# Patient Record
Sex: Female | Born: 1985 | Race: Black or African American | Hispanic: No | Marital: Married | Smoking: Never smoker
Health system: Southern US, Community
[De-identification: ages and names within clinical notes are randomized; demographics above are authoritative.]

## PROBLEM LIST (undated history)

## (undated) ENCOUNTER — Inpatient Hospital Stay (HOSPITAL_COMMUNITY): Payer: Self-pay

## (undated) DIAGNOSIS — Z8619 Personal history of other infectious and parasitic diseases: Secondary | ICD-10-CM

## (undated) DIAGNOSIS — J45909 Unspecified asthma, uncomplicated: Secondary | ICD-10-CM

## (undated) HISTORY — DX: Personal history of other infectious and parasitic diseases: Z86.19

## (undated) HISTORY — PX: BREAST BIOPSY: SHX20

## (undated) HISTORY — PX: BREAST CYST EXCISION: SHX579

---

## 2006-11-30 ENCOUNTER — Emergency Department (HOSPITAL_COMMUNITY): Admission: EM | Admit: 2006-11-30 | Discharge: 2006-12-01 | Payer: Self-pay | Admitting: Emergency Medicine

## 2009-02-09 ENCOUNTER — Encounter: Payer: Self-pay | Admitting: Nurse Practitioner

## 2009-03-02 ENCOUNTER — Ambulatory Visit: Payer: Self-pay | Admitting: Internal Medicine

## 2009-03-02 DIAGNOSIS — K625 Hemorrhage of anus and rectum: Secondary | ICD-10-CM

## 2009-03-02 DIAGNOSIS — R142 Eructation: Secondary | ICD-10-CM

## 2009-03-02 DIAGNOSIS — K5900 Constipation, unspecified: Secondary | ICD-10-CM | POA: Insufficient documentation

## 2009-03-02 DIAGNOSIS — R143 Flatulence: Secondary | ICD-10-CM

## 2009-03-02 DIAGNOSIS — R141 Gas pain: Secondary | ICD-10-CM

## 2009-03-02 DIAGNOSIS — R12 Heartburn: Secondary | ICD-10-CM

## 2009-03-16 ENCOUNTER — Telehealth (INDEPENDENT_AMBULATORY_CARE_PROVIDER_SITE_OTHER): Payer: Self-pay | Admitting: *Deleted

## 2010-12-29 LAB — POCT URINALYSIS DIP (DEVICE)
Glucose, UA: NEGATIVE
Nitrite: NEGATIVE
Operator id: 247071
Specific Gravity, Urine: 1.025
Urobilinogen, UA: 4 — ABNORMAL HIGH

## 2010-12-29 LAB — DIFFERENTIAL
Lymphs Abs: 1.1
Monocytes Relative: 12 — ABNORMAL HIGH
Neutro Abs: 2.6
Neutrophils Relative %: 61

## 2010-12-29 LAB — POCT I-STAT CREATININE: Creatinine, Ser: 0.7

## 2010-12-29 LAB — CBC
MCV: 72.9 — ABNORMAL LOW
RBC: 5.2 — ABNORMAL HIGH
WBC: 4.2

## 2010-12-29 LAB — I-STAT 8, (EC8 V) (CONVERTED LAB)
Acid-base deficit: 1
Chloride: 104
HCT: 44
Operator id: 265201
Potassium: 3.6
Sodium: 138

## 2014-04-28 LAB — OB RESULTS CONSOLE GC/CHLAMYDIA
CHLAMYDIA, DNA PROBE: NEGATIVE
Gonorrhea: NEGATIVE

## 2014-04-28 LAB — OB RESULTS CONSOLE HIV ANTIBODY (ROUTINE TESTING): HIV: NONREACTIVE

## 2014-04-28 LAB — OB RESULTS CONSOLE RUBELLA ANTIBODY, IGM: RUBELLA: IMMUNE

## 2014-04-28 LAB — OB RESULTS CONSOLE ABO/RH: RH Type: POSITIVE

## 2014-04-28 LAB — OB RESULTS CONSOLE HEPATITIS B SURFACE ANTIGEN: HEP B S AG: NEGATIVE

## 2014-04-28 LAB — OB RESULTS CONSOLE ANTIBODY SCREEN: ANTIBODY SCREEN: NEGATIVE

## 2014-04-28 LAB — OB RESULTS CONSOLE RPR: RPR: NONREACTIVE

## 2014-07-13 LAB — US OB FOLLOW UP

## 2014-07-14 ENCOUNTER — Other Ambulatory Visit (HOSPITAL_COMMUNITY): Payer: Self-pay | Admitting: Obstetrics and Gynecology

## 2014-07-14 DIAGNOSIS — O283 Abnormal ultrasonic finding on antenatal screening of mother: Secondary | ICD-10-CM

## 2014-07-14 DIAGNOSIS — Z3A20 20 weeks gestation of pregnancy: Secondary | ICD-10-CM

## 2014-07-14 DIAGNOSIS — Z3689 Encounter for other specified antenatal screening: Secondary | ICD-10-CM

## 2014-07-23 ENCOUNTER — Ambulatory Visit (HOSPITAL_COMMUNITY)
Admission: RE | Admit: 2014-07-23 | Discharge: 2014-07-23 | Disposition: A | Discharge status: Home / Self Care | Payer: BLUE CROSS/BLUE SHIELD | Source: Ambulatory Visit | Attending: Obstetrics and Gynecology | Admitting: Obstetrics and Gynecology

## 2014-07-23 ENCOUNTER — Encounter (HOSPITAL_COMMUNITY): Payer: Self-pay

## 2014-07-23 ENCOUNTER — Ambulatory Visit (HOSPITAL_COMMUNITY): Admission: RE | Admit: 2014-07-23 | Payer: BLUE CROSS/BLUE SHIELD | Source: Ambulatory Visit

## 2014-07-23 ENCOUNTER — Ambulatory Visit (HOSPITAL_COMMUNITY): Payer: Self-pay

## 2014-07-23 DIAGNOSIS — O99512 Diseases of the respiratory system complicating pregnancy, second trimester: Secondary | ICD-10-CM | POA: Insufficient documentation

## 2014-07-23 DIAGNOSIS — IMO0002 Reserved for concepts with insufficient information to code with codable children: Secondary | ICD-10-CM | POA: Insufficient documentation

## 2014-07-23 DIAGNOSIS — Z36 Encounter for antenatal screening of mother: Secondary | ICD-10-CM | POA: Diagnosis not present

## 2014-07-23 DIAGNOSIS — Z3A19 19 weeks gestation of pregnancy: Secondary | ICD-10-CM | POA: Insufficient documentation

## 2014-07-23 DIAGNOSIS — Z3A20 20 weeks gestation of pregnancy: Secondary | ICD-10-CM

## 2014-07-23 DIAGNOSIS — J45909 Unspecified asthma, uncomplicated: Secondary | ICD-10-CM | POA: Insufficient documentation

## 2014-07-23 DIAGNOSIS — O283 Abnormal ultrasonic finding on antenatal screening of mother: Secondary | ICD-10-CM

## 2014-07-23 DIAGNOSIS — Z3689 Encounter for other specified antenatal screening: Secondary | ICD-10-CM | POA: Insufficient documentation

## 2014-07-23 DIAGNOSIS — O350XX0 Maternal care for (suspected) central nervous system malformation in fetus, not applicable or unspecified: Secondary | ICD-10-CM | POA: Diagnosis not present

## 2014-07-23 HISTORY — DX: Unspecified asthma, uncomplicated: J45.909

## 2014-07-26 ENCOUNTER — Other Ambulatory Visit (HOSPITAL_COMMUNITY): Payer: Self-pay | Admitting: Obstetrics and Gynecology

## 2014-07-26 ENCOUNTER — Encounter (HOSPITAL_COMMUNITY): Payer: Self-pay | Admitting: Obstetrics and Gynecology

## 2014-07-29 ENCOUNTER — Encounter (HOSPITAL_COMMUNITY): Payer: Self-pay

## 2014-07-29 ENCOUNTER — Ambulatory Visit (HOSPITAL_COMMUNITY): Payer: Self-pay

## 2014-11-19 LAB — OB RESULTS CONSOLE GBS: GBS: NEGATIVE

## 2014-12-04 ENCOUNTER — Inpatient Hospital Stay (HOSPITAL_COMMUNITY)
Admission: AD | Admit: 2014-12-04 | Discharge: 2014-12-04 | Disposition: A | Discharge status: Home / Self Care | Payer: Commercial Managed Care - PPO | Source: Ambulatory Visit | Attending: Obstetrics and Gynecology | Admitting: Obstetrics and Gynecology

## 2014-12-04 ENCOUNTER — Inpatient Hospital Stay (HOSPITAL_COMMUNITY): Payer: Commercial Managed Care - PPO

## 2014-12-04 ENCOUNTER — Encounter (HOSPITAL_COMMUNITY): Payer: Self-pay | Admitting: *Deleted

## 2014-12-04 DIAGNOSIS — Z3A39 39 weeks gestation of pregnancy: Secondary | ICD-10-CM | POA: Diagnosis not present

## 2014-12-04 DIAGNOSIS — O4693 Antepartum hemorrhage, unspecified, third trimester: Secondary | ICD-10-CM | POA: Insufficient documentation

## 2014-12-04 DIAGNOSIS — O4290 Premature rupture of membranes, unspecified as to length of time between rupture and onset of labor, unspecified weeks of gestation: Secondary | ICD-10-CM

## 2014-12-04 DIAGNOSIS — O26893 Other specified pregnancy related conditions, third trimester: Secondary | ICD-10-CM | POA: Diagnosis present

## 2014-12-04 LAB — AMNISURE RUPTURE OF MEMBRANE (ROM) NOT AT ARMC: Amnisure ROM: NEGATIVE

## 2014-12-04 LAB — URINALYSIS, ROUTINE W REFLEX MICROSCOPIC
BILIRUBIN URINE: NEGATIVE
GLUCOSE, UA: NEGATIVE mg/dL
Hgb urine dipstick: NEGATIVE
KETONES UR: 15 mg/dL — AB
LEUKOCYTES UA: NEGATIVE
Nitrite: NEGATIVE
PH: 6.5 (ref 5.0–8.0)
Protein, ur: NEGATIVE mg/dL
SPECIFIC GRAVITY, URINE: 1.02 (ref 1.005–1.030)
Urobilinogen, UA: 1 mg/dL (ref 0.0–1.0)

## 2014-12-04 LAB — CBC
HCT: 30.8 % — ABNORMAL LOW (ref 36.0–46.0)
Hemoglobin: 10.1 g/dL — ABNORMAL LOW (ref 12.0–15.0)
MCH: 24.6 pg — ABNORMAL LOW (ref 26.0–34.0)
MCHC: 32.8 g/dL (ref 30.0–36.0)
MCV: 74.9 fL — ABNORMAL LOW (ref 78.0–100.0)
Platelets: 170 10*3/uL (ref 150–400)
RBC: 4.11 MIL/uL (ref 3.87–5.11)
RDW: 15 % (ref 11.5–15.5)
WBC: 9.8 10*3/uL (ref 4.0–10.5)

## 2014-12-04 LAB — FIBRINOGEN: Fibrinogen: 438 mg/dL (ref 204–475)

## 2014-12-04 LAB — PROTIME-INR
INR: 1.03 (ref 0.00–1.49)
Prothrombin Time: 13.7 seconds (ref 11.6–15.2)

## 2014-12-04 LAB — APTT: aPTT: 28 seconds (ref 24–37)

## 2014-12-04 NOTE — MAU Note (Signed)
Pt states last night about 1800 she got up to use the bathroom.  She noticed her panties had gotten wet along with some vaginal discharge when whipping with toilet paper.  Pt states her panties continue to get somewhat wet at times.  No vaginal bleeding or big loss of fluid.  Good fetal movement.  Pt states she has been experiencing some braxton hicks contractions but that have stopped.

## 2014-12-04 NOTE — Discharge Instructions (Signed)
Placental Abruption °Your placenta is the organ that nourishes your unborn baby (fetus). Your baby gets his or her blood supply and nutrients through your placenta. It is your baby's life support system. It is attached to the inside of your uterus until after your baby is born.  °Placental abruption is when the placenta partly or completely separates from the uterus before your baby is born. This is rare, but it can happen any time after 20 weeks of pregnancy. A small separation may not cause problems, but a large separation may be dangerous for you and your baby. °CAUSES  °Most of the time the cause of a placental abruption is unknown. Though it is rare, a placental abruption can be caused by:  °· An abdominal injury.   °· The baby turning from a buttocks-first position (breech presentation) or a sideways position (transverse) to a headfirst position (cephalic).   °· Delivering the first of multiple babies (twins, triplets, or more).   °· Sudden loss of amniotic fluid (premature rupture of the membranes).   °· An abnormally short umbilical cord. °RISK FACTORS °Some risk factors make a placental abruption more likely, including: °· History of placental abruption. °· High blood pressure (hypertension). °· Smoking. °· Alcohol intake. °· Blood clotting problems. °· Too much amniotic fluid. °· Having had multiples (twins or triplets or more). °· Seizures and convulsions. °· Diabetes mellitus. °· Having had more than four children. °· Age 35 years or older. °· Illegal drug use. °· Injury to your abdomen. °SIGNS AND SYMPTOMS  °A small placental abruption may not cause symptoms. If you do have symptoms, they may include: °· Mild abdominal pain. °· Slight vaginal bleeding. °Symptoms of severe placental abruption depend on the size of the separation and the stage of pregnancy. Symptoms may include:  °· Sudden pain in your uterus. °· Abdominal pain. °· Vaginal bleeding. °· Tender uterus. °· Severe abdominal pain with  tenderness. °· Continual contractions of your uterus. °· Back pain. °· Weakness, light-headedness. °DIAGNOSIS  °Placental abruption is suspected when a pregnant woman develops sudden pain in her uterus. The health care provider will check whether the uterus is very tender, hard, and enlarging and whether the baby has an abnormal heart rate or rhythm. Ultrasonography (commonly called an ultrasound) will be done. Blood work will also be done to make sure that there are enough healthy red blood cells and that there are no clotting problems or signs of too much blood loss. °TREATMENT  °Placental abruption is usually an emergency. It requires treatment right away. Your treatment will depend on:  °· The amount of bleeding. °· Whether you or you baby are in distress. °· The stage of your pregnancy. °· The maturity of the baby. °Treatment for partial separation of the placenta is bed rest and close observation. You also may need a blood transfusion or to receive fluids through an IV tube. Treatment for complete placental separation is delivery of your baby. You may have a cesarean delivery if your baby is in distress. °HOME CARE INSTRUCTIONS  °· Only take medicines as directed by your health care provider. °· Arrange for help at home before and after you deliver the baby, especially if you had a cesarean delivery or lost a lot of blood. °· Get plenty of rest and sleep. °· Do not have sexual intercourse until your health care provider says it is okay. °· Do not use tampons or douche unless your health care provider says it is okay. °SEEK MEDICAL CARE IF: °· You   have light vaginal bleeding or spotting. °· You have any type of trauma, such as a fall or jolt during an accident. °· You are having trouble avoiding drugs, alcohol, or smoking. °SEEK IMMEDIATE MEDICAL CARE IF: °· You have vaginal bleeding. °· You have abdominal pain. °· You have continuous uterine contractions. °· You have a hard, tender uterus. °· You do not feel  the baby move, or the baby moves very little. °MAKE SURE YOU: °· Understand these instructions. °· Will watch your condition. °· Will get help right away if you are not doing well or get worse. °Document Released: 03/05/2005 Document Revised: 03/10/2013 Document Reviewed: 12/26/2012 °ExitCare® Patient Information ©2015 [employer]. This information is not intended to replace advice given to you by your health care provider. Make sure you discuss any questions you have with your health care provider. ° °

## 2014-12-04 NOTE — MAU Provider Note (Signed)
History     CSN: [id number]  Arrival date and time: 12/04/14 1248   First Provider Initiated Contact with Patient 12/04/14 1624      Chief Complaint  Patient presents with  . Rupture of Membranes   HPI  Kaitlyn Ray 29 y.o. G1P0 [redacted]w[redacted]d preents to the MAU stating that she thinks her water broke at 600pm last night. Denies leaking fluid since then but states her stomach is sore in her lower abdomen. Reports positive fetal movement. Nurse has asked for me to perform spec exam. Positive for pooling.  Past Medical History  Diagnosis Date  . Asthma     Past Surgical History  Procedure Laterality Date  . Breast biopsy    . Breast cyst excision      History reviewed. No pertinent family history.  Social History  Substance Use Topics  . Smoking status: Never Smoker   . Smokeless tobacco: None  . Alcohol Use: No    Allergies: No Known Allergies  Prescriptions prior to admission  Medication Sig Dispense Refill Last Dose  . albuterol (PROVENTIL HFA;VENTOLIN HFA) 108 (90 BASE) MCG/ACT inhaler Inhale 2 puffs into the lungs every 6 (six) hours as needed for wheezing or shortness of breath.    prn  . Calcium Carbonate-Vitamin D (CALCIUM + D PO) Take 1 tablet by mouth daily.    Past Week at Unknown time  . docusate sodium (COLACE) 100 MG capsule Take 100 mg by mouth daily.   Past Week at Unknown time  . Prenat w/o A-FeCbGl-DSS-FA-DHA (CITRANATAL 90 DHA) 90-1 & 300 MG MISC Take 1 tablet by mouth daily.    Past Week at Unknown time    Review of Systems  Constitutional: Negative for fever.  Gastrointestinal: Positive for abdominal pain.  All other systems reviewed and are negative.  Physical Exam   Blood pressure 126/85, pulse 85, temperature 97.9 F (36.6 C), temperature source Oral, resp. rate 18, last menstrual period 02/23/2014, SpO2 99 %.  Physical Exam  Nursing note and vitals reviewed. Constitutional: She is oriented to person, place, and time. She appears  well-developed and well-nourished. No distress.  HENT:  Head: Normocephalic and atraumatic.  Neck: Normal range of motion.  Cardiovascular: Normal rate.   Respiratory: Effort normal. No respiratory distress.  GI: Soft. There is tenderness.  Musculoskeletal: She exhibits no edema.  Neurological: She is alert and oriented to person, place, and time.  Skin: Skin is warm and dry.  Psychiatric: She has a normal mood and affect. Her behavior is normal. Judgment and thought content normal.   On 2nd spec exam approx 30cc blood seen coming from cervix  Placenta posterior above cervical os- AFI 16. Results for orders placed or performed during the hospital encounter of 12/04/14 (from the past 24 hour(s))  Urinalysis, Routine w reflex microscopic (not at [facility])     Status: Abnormal   Collection Time: 12/04/14  1:00 PM  Result Value Ref Range   Color, Urine YELLOW YELLOW   APPearance CLEAR CLEAR   Specific Gravity, Urine 1.020 1.005 - 1.030   pH 6.5 5.0 - 8.0   Glucose, UA NEGATIVE NEGATIVE mg/dL   Hgb urine dipstick NEGATIVE NEGATIVE   Bilirubin Urine NEGATIVE NEGATIVE   Ketones, ur 15 (A) NEGATIVE mg/dL   Protein, ur NEGATIVE NEGATIVE mg/dL   Urobilinogen, UA 1.0 0.0 - 1.0 mg/dL   Nitrite NEGATIVE NEGATIVE   Leukocytes, UA NEGATIVE NEGATIVE  Amnisure rupture of membrane (rom)not at [facility]  Status: None   Collection Time: 12/04/14  2:00 PM  Result Value Ref Range   Amnisure ROM NEGATIVE   CBC     Status: Abnormal   Collection Time: 12/04/14  2:58 PM  Result Value Ref Range   WBC 9.8 4.0 - 10.5 K/uL   RBC 4.11 3.87 - 5.11 MIL/uL   Hemoglobin 10.1 (L) 12.0 - 15.0 g/dL   HCT 19.1 (L) 47.8 - 29.5 %   MCV 74.9 (L) 78.0 - 100.0 fL   MCH 24.6 (L) 26.0 - 34.0 pg   MCHC 32.8 30.0 - 36.0 g/dL   RDW 62.1 30.8 - 65.7 %   Platelets 170 150 - 400 K/uL  Protime-INR     Status: None   Collection Time: 12/04/14  5:05 PM  Result Value Ref Range   Prothrombin Time 13.7 11.6 - 15.2 seconds   INR  1.03 0.00 - 1.49  APTT     Status: None   Collection Time: 12/04/14  5:05 PM  Result Value Ref Range   aPTT 28 24 - 37 seconds  Fibrinogen     Status: None   Collection Time: 12/04/14  5:05 PM  Result Value Ref Range   Fibrinogen 438 204 - 475 mg/dL   MAU Course  Procedures  MDM After all labs are back and stable. With normal AFI and reactive fetal monitoring strip. Pt will be discharge to home per Dr Vincente Poli.  Assessment and Plan  Vaginal bleeding in pregnancy  Discharge to home  [facility] 12/04/2014, 4:43 PM

## 2014-12-09 ENCOUNTER — Telehealth (HOSPITAL_COMMUNITY): Payer: Self-pay | Admitting: *Deleted

## 2014-12-09 ENCOUNTER — Encounter (HOSPITAL_COMMUNITY): Payer: Self-pay | Admitting: *Deleted

## 2014-12-09 NOTE — Telephone Encounter (Signed)
Preadmission screen  

## 2014-12-13 MED ORDER — BUTORPHANOL TARTRATE 1 MG/ML IJ SOLN
1.0000 mg | INTRAMUSCULAR | Status: DC | PRN
  Administered 2014-12-14: 1 mg via INTRAVENOUS
  Filled 2014-12-13 (×2): qty 1

## 2014-12-13 MED ORDER — PROMETHAZINE HCL 25 MG/ML IJ SOLN
12.5000 mg | INTRAMUSCULAR | Status: DC | PRN
  Filled 2014-12-13: qty 1

## 2014-12-14 ENCOUNTER — Inpatient Hospital Stay (HOSPITAL_COMMUNITY): Payer: Commercial Managed Care - PPO | Admitting: Anesthesiology

## 2014-12-14 ENCOUNTER — Inpatient Hospital Stay (HOSPITAL_COMMUNITY)
Admission: RE | Admit: 2014-12-14 | Discharge: 2014-12-17 | DRG: 766 | Disposition: A | Discharge status: Home / Self Care | Payer: Commercial Managed Care - PPO | Source: Ambulatory Visit | Attending: Obstetrics and Gynecology | Admitting: Obstetrics and Gynecology

## 2014-12-14 ENCOUNTER — Encounter (HOSPITAL_COMMUNITY)
Admission: RE | Disposition: A | Discharge status: Home / Self Care | Payer: Self-pay | Source: Ambulatory Visit | Attending: Obstetrics and Gynecology

## 2014-12-14 ENCOUNTER — Encounter (HOSPITAL_COMMUNITY): Payer: Self-pay

## 2014-12-14 DIAGNOSIS — J45909 Unspecified asthma, uncomplicated: Secondary | ICD-10-CM | POA: Diagnosis present

## 2014-12-14 DIAGNOSIS — O9952 Diseases of the respiratory system complicating childbirth: Secondary | ICD-10-CM | POA: Diagnosis present

## 2014-12-14 DIAGNOSIS — Z3A40 40 weeks gestation of pregnancy: Secondary | ICD-10-CM | POA: Diagnosis present

## 2014-12-14 DIAGNOSIS — O480 Post-term pregnancy: Secondary | ICD-10-CM | POA: Diagnosis present

## 2014-12-14 LAB — TYPE AND SCREEN
ABO/RH(D): O POS
ANTIBODY SCREEN: NEGATIVE

## 2014-12-14 LAB — ABO/RH: ABO/RH(D): O POS

## 2014-12-14 LAB — CBC
HEMATOCRIT: 32.8 % — AB (ref 36.0–46.0)
HEMOGLOBIN: 11 g/dL — AB (ref 12.0–15.0)
MCH: 25.1 pg — ABNORMAL LOW (ref 26.0–34.0)
MCHC: 33.5 g/dL (ref 30.0–36.0)
MCV: 74.7 fL — ABNORMAL LOW (ref 78.0–100.0)
Platelets: 172 10*3/uL (ref 150–400)
RBC: 4.39 MIL/uL (ref 3.87–5.11)
RDW: 15 % (ref 11.5–15.5)
WBC: 9.9 10*3/uL (ref 4.0–10.5)

## 2014-12-14 SURGERY — Surgical Case
Anesthesia: Epidural

## 2014-12-14 MED ORDER — TETANUS-DIPHTH-ACELL PERTUSSIS 5-2.5-18.5 LF-MCG/0.5 IM SUSP: 0.5000 mL | Freq: Once | INTRAMUSCULAR | Status: DC

## 2014-12-14 MED ORDER — SODIUM CHLORIDE 0.9 % IJ SOLN: 3.0000 mL | INTRAMUSCULAR | Status: DC | PRN

## 2014-12-14 MED ORDER — ACETAMINOPHEN 325 MG PO TABS: 650.0000 mg | ORAL_TABLET | ORAL | Status: DC | PRN

## 2014-12-14 MED ORDER — OXYTOCIN 10 UNIT/ML IJ SOLN
40.0000 [IU] | INTRAVENOUS | Status: DC | PRN
  Administered 2014-12-14: 40 [IU] via INTRAVENOUS

## 2014-12-14 MED ORDER — EPHEDRINE 5 MG/ML INJ: 10.0000 mg | INTRAVENOUS | Status: DC | PRN

## 2014-12-14 MED ORDER — CROMOLYN SODIUM 4 % OP SOLN
1.0000 [drp] | Freq: Four times a day (QID) | OPHTHALMIC | Status: DC
Start: 2014-12-14 — End: 2014-12-17
  Administered 2014-12-14 – 2014-12-16 (×5): 1 [drp] via OPHTHALMIC

## 2014-12-14 MED ORDER — MEPERIDINE HCL 25 MG/ML IJ SOLN
INTRAMUSCULAR | Status: DC | PRN
  Administered 2014-12-14 (×2): 12.5 mg via INTRAVENOUS

## 2014-12-14 MED ORDER — MORPHINE SULFATE (PF) 0.5 MG/ML IJ SOLN
INTRAMUSCULAR | Status: DC | PRN
  Administered 2014-12-14: 3 mg via EPIDURAL

## 2014-12-14 MED ORDER — ZOLPIDEM TARTRATE 5 MG PO TABS: 5.0000 mg | ORAL_TABLET | Freq: Every evening | ORAL | Status: DC | PRN

## 2014-12-14 MED ORDER — FENTANYL CITRATE (PF) 100 MCG/2ML IJ SOLN
INTRAMUSCULAR | Status: AC
  Administered 2014-12-14: 50 ug via INTRAVENOUS
  Filled 2014-12-14: qty 2

## 2014-12-14 MED ORDER — NALBUPHINE HCL 10 MG/ML IJ SOLN
5.0000 mg | INTRAMUSCULAR | Status: DC | PRN
  Administered 2014-12-14: 5 mg via SUBCUTANEOUS
  Filled 2014-12-14 (×2): qty 0.5

## 2014-12-14 MED ORDER — TERBUTALINE SULFATE 1 MG/ML IJ SOLN: 0.2500 mg | Freq: Once | INTRAMUSCULAR | Status: DC | PRN

## 2014-12-14 MED ORDER — ONDANSETRON HCL 4 MG/2ML IJ SOLN: 4.0000 mg | Freq: Three times a day (TID) | INTRAMUSCULAR | Status: DC | PRN

## 2014-12-14 MED ORDER — IBUPROFEN 600 MG PO TABS
600.0000 mg | ORAL_TABLET | Freq: Four times a day (QID) | ORAL | Status: DC
  Administered 2014-12-15 – 2014-12-17 (×12): 600 mg via ORAL
  Filled 2014-12-14 (×12): qty 1

## 2014-12-14 MED ORDER — FENTANYL CITRATE (PF) 100 MCG/2ML IJ SOLN
INTRAMUSCULAR | Status: DC | PRN
  Administered 2014-12-14 (×2): 50 ug via INTRAVENOUS

## 2014-12-14 MED ORDER — ONDANSETRON HCL 4 MG/2ML IJ SOLN: 4.0000 mg | Freq: Four times a day (QID) | INTRAMUSCULAR | Status: DC | PRN

## 2014-12-14 MED ORDER — LIDOCAINE HCL (PF) 1 % IJ SOLN: 30.0000 mL | INTRAMUSCULAR | Status: DC | PRN

## 2014-12-14 MED ORDER — ONDANSETRON HCL 4 MG/2ML IJ SOLN
INTRAMUSCULAR | Status: DC | PRN
  Administered 2014-12-14: 4 mg via INTRAVENOUS

## 2014-12-14 MED ORDER — OXYTOCIN 40 UNITS IN LACTATED RINGERS INFUSION - SIMPLE MED: 62.5000 mL/h | INTRAVENOUS | Status: AC

## 2014-12-14 MED ORDER — SIMETHICONE 80 MG PO CHEW: 80.0000 mg | CHEWABLE_TABLET | ORAL | Status: DC | PRN

## 2014-12-14 MED ORDER — MORPHINE SULFATE (PF) 0.5 MG/ML IJ SOLN
INTRAMUSCULAR | Status: AC
  Filled 2014-12-14: qty 100

## 2014-12-14 MED ORDER — OXYCODONE-ACETAMINOPHEN 5-325 MG PO TABS: 2.0000 | ORAL_TABLET | ORAL | Status: DC | PRN

## 2014-12-14 MED ORDER — NALOXONE HCL 0.4 MG/ML IJ SOLN: 0.4000 mg | INTRAMUSCULAR | Status: DC | PRN

## 2014-12-14 MED ORDER — BUPIVACAINE HCL (PF) 0.25 % IJ SOLN
INTRAMUSCULAR | Status: DC | PRN
  Administered 2014-12-14 (×2): 4 mL via EPIDURAL

## 2014-12-14 MED ORDER — INFLUENZA VAC SPLIT QUAD 0.5 ML IM SUSY
0.5000 mL | PREFILLED_SYRINGE | INTRAMUSCULAR | Status: DC
  Filled 2014-12-14: qty 0.5

## 2014-12-14 MED ORDER — ACETAMINOPHEN 325 MG PO TABS
650.0000 mg | ORAL_TABLET | ORAL | Status: DC | PRN
  Administered 2014-12-14: 650 mg via ORAL
  Filled 2014-12-14: qty 2

## 2014-12-14 MED ORDER — FENTANYL CITRATE (PF) 100 MCG/2ML IJ SOLN
25.0000 ug | INTRAMUSCULAR | Status: DC | PRN
  Administered 2014-12-14 (×3): 50 ug via INTRAVENOUS

## 2014-12-14 MED ORDER — SIMETHICONE 80 MG PO CHEW
80.0000 mg | CHEWABLE_TABLET | ORAL | Status: DC
Start: 2014-12-15 — End: 2014-12-17
  Administered 2014-12-15 – 2014-12-17 (×3): 80 mg via ORAL
  Filled 2014-12-14 (×3): qty 1

## 2014-12-14 MED ORDER — OXYCODONE-ACETAMINOPHEN 5-325 MG PO TABS
1.0000 | ORAL_TABLET | ORAL | Status: DC | PRN
  Filled 2014-12-14: qty 1

## 2014-12-14 MED ORDER — LACTATED RINGERS IV SOLN: INTRAVENOUS | Status: DC

## 2014-12-14 MED ORDER — ALBUTEROL SULFATE (2.5 MG/3ML) 0.083% IN NEBU: 3.0000 mL | INHALATION_SOLUTION | Freq: Four times a day (QID) | RESPIRATORY_TRACT | Status: DC | PRN

## 2014-12-14 MED ORDER — FENTANYL 2.5 MCG/ML BUPIVACAINE 1/10 % EPIDURAL INFUSION (WH - ANES)
14.0000 mL/h | INTRAMUSCULAR | Status: DC | PRN
  Administered 2014-12-14: 14 mL/h via EPIDURAL
  Filled 2014-12-14: qty 125

## 2014-12-14 MED ORDER — PRENATAL MULTIVITAMIN CH
1.0000 | ORAL_TABLET | Freq: Every day | ORAL | Status: DC
  Administered 2014-12-15 – 2014-12-17 (×3): 1 via ORAL
  Filled 2014-12-14 (×3): qty 1

## 2014-12-14 MED ORDER — OXYCODONE-ACETAMINOPHEN 5-325 MG PO TABS: 1.0000 | ORAL_TABLET | ORAL | Status: DC | PRN

## 2014-12-14 MED ORDER — DIPHENHYDRAMINE HCL 25 MG PO CAPS: 25.0000 mg | ORAL_CAPSULE | Freq: Four times a day (QID) | ORAL | Status: DC | PRN

## 2014-12-14 MED ORDER — OXYTOCIN 40 UNITS IN LACTATED RINGERS INFUSION - SIMPLE MED
1.0000 m[IU]/min | INTRAVENOUS | Status: DC
  Administered 2014-12-14: 2 m[IU]/min via INTRAVENOUS
  Filled 2014-12-14: qty 1000

## 2014-12-14 MED ORDER — FENTANYL CITRATE (PF) 100 MCG/2ML IJ SOLN
INTRAMUSCULAR | Status: AC
  Filled 2014-12-14: qty 2

## 2014-12-14 MED ORDER — KETOROLAC TROMETHAMINE 30 MG/ML IJ SOLN
INTRAMUSCULAR | Status: AC
  Administered 2014-12-14: 30 mg via INTRAVENOUS
  Filled 2014-12-14: qty 1

## 2014-12-14 MED ORDER — NALBUPHINE HCL 10 MG/ML IJ SOLN
5.0000 mg | Freq: Once | INTRAMUSCULAR | Status: DC | PRN
  Filled 2014-12-14 (×2): qty 0.5

## 2014-12-14 MED ORDER — OXYTOCIN BOLUS FROM INFUSION: 500.0000 mL | INTRAVENOUS | Status: DC

## 2014-12-14 MED ORDER — SCOPOLAMINE 1 MG/3DAYS TD PT72
MEDICATED_PATCH | TRANSDERMAL | Status: AC
  Administered 2014-12-14: 1.5 mg via TRANSDERMAL
  Filled 2014-12-14: qty 1

## 2014-12-14 MED ORDER — MEPERIDINE HCL 25 MG/ML IJ SOLN
INTRAMUSCULAR | Status: AC
  Filled 2014-12-14: qty 1

## 2014-12-14 MED ORDER — CITRIC ACID-SODIUM CITRATE 334-500 MG/5ML PO SOLN: 30.0000 mL | ORAL | Status: DC | PRN

## 2014-12-14 MED ORDER — LIDOCAINE-EPINEPHRINE (PF) 2 %-1:200000 IJ SOLN
INTRAMUSCULAR | Status: DC | PRN
Start: 2014-12-14 — End: 2014-12-14
  Administered 2014-12-14: 4 mL

## 2014-12-14 MED ORDER — OXYTOCIN 40 UNITS IN LACTATED RINGERS INFUSION - SIMPLE MED: 62.5000 mL/h | INTRAVENOUS | Status: DC

## 2014-12-14 MED ORDER — CITRIC ACID-SODIUM CITRATE 334-500 MG/5ML PO SOLN
ORAL | Status: AC
  Administered 2014-12-14: 30 mL
  Filled 2014-12-14: qty 15

## 2014-12-14 MED ORDER — NALBUPHINE HCL 10 MG/ML IJ SOLN
5.0000 mg | INTRAMUSCULAR | Status: DC | PRN
  Administered 2014-12-14: 5 mg via INTRAVENOUS
  Filled 2014-12-14 (×2): qty 0.5

## 2014-12-14 MED ORDER — LACTATED RINGERS IV SOLN
INTRAVENOUS | Status: DC
  Administered 2014-12-14: 07:00:00 via INTRAVENOUS

## 2014-12-14 MED ORDER — SODIUM CHLORIDE 0.9 % IR SOLN
Status: DC | PRN
  Administered 2014-12-14: 1

## 2014-12-14 MED ORDER — KETOROLAC TROMETHAMINE 30 MG/ML IJ SOLN: 30.0000 mg | Freq: Four times a day (QID) | INTRAMUSCULAR | Status: AC | PRN

## 2014-12-14 MED ORDER — DIPHENHYDRAMINE HCL 50 MG/ML IJ SOLN: 12.5000 mg | INTRAMUSCULAR | Status: DC | PRN

## 2014-12-14 MED ORDER — SIMETHICONE 80 MG PO CHEW
80.0000 mg | CHEWABLE_TABLET | Freq: Three times a day (TID) | ORAL | Status: DC
Start: 2014-12-14 — End: 2014-12-17
  Administered 2014-12-14 – 2014-12-17 (×7): 80 mg via ORAL
  Filled 2014-12-14 (×8): qty 1

## 2014-12-14 MED ORDER — MEPERIDINE HCL 25 MG/ML IJ SOLN: 6.2500 mg | INTRAMUSCULAR | Status: DC | PRN

## 2014-12-14 MED ORDER — PNEUMOCOCCAL VAC POLYVALENT 25 MCG/0.5ML IJ INJ
0.5000 mL | INJECTION | INTRAMUSCULAR | Status: DC
  Filled 2014-12-14: qty 0.5

## 2014-12-14 MED ORDER — LACTATED RINGERS IV SOLN
INTRAVENOUS | Status: DC | PRN
  Administered 2014-12-14 (×2): via INTRAVENOUS

## 2014-12-14 MED ORDER — ONDANSETRON HCL 4 MG/2ML IJ SOLN
INTRAMUSCULAR | Status: AC
  Filled 2014-12-14: qty 2

## 2014-12-14 MED ORDER — KETOROLAC TROMETHAMINE 30 MG/ML IJ SOLN
30.0000 mg | Freq: Four times a day (QID) | INTRAMUSCULAR | Status: AC | PRN
  Administered 2014-12-14 (×2): 30 mg via INTRAVENOUS
  Filled 2014-12-14: qty 1

## 2014-12-14 MED ORDER — INFLUENZA VAC SPLIT QUAD 0.5 ML IM SUSY
0.5000 mL | PREFILLED_SYRINGE | Freq: Once | INTRAMUSCULAR | Status: AC
  Administered 2014-12-14: 0.5 mL via INTRAMUSCULAR
  Filled 2014-12-14: qty 0.5

## 2014-12-14 MED ORDER — LACTATED RINGERS IV SOLN: 500.0000 mL | INTRAVENOUS | Status: DC | PRN

## 2014-12-14 MED ORDER — DIBUCAINE 1 % RE OINT: 1.0000 "application " | TOPICAL_OINTMENT | RECTAL | Status: DC | PRN

## 2014-12-14 MED ORDER — LANOLIN HYDROUS EX OINT: 1.0000 "application " | TOPICAL_OINTMENT | CUTANEOUS | Status: DC | PRN

## 2014-12-14 MED ORDER — MENTHOL 3 MG MT LOZG: 1.0000 | LOZENGE | OROMUCOSAL | Status: DC | PRN

## 2014-12-14 MED ORDER — WITCH HAZEL-GLYCERIN EX PADS: 1.0000 "application " | MEDICATED_PAD | CUTANEOUS | Status: DC | PRN

## 2014-12-14 MED ORDER — SENNOSIDES-DOCUSATE SODIUM 8.6-50 MG PO TABS
2.0000 | ORAL_TABLET | ORAL | Status: DC
Start: 2014-12-15 — End: 2014-12-17
  Administered 2014-12-15 – 2014-12-17 (×3): 2 via ORAL
  Filled 2014-12-14 (×3): qty 2

## 2014-12-14 MED ORDER — CEFOTETAN DISODIUM 2 G IJ SOLR
2.0000 g | Freq: Once | INTRAMUSCULAR | Status: AC
  Administered 2014-12-14: 2 g via INTRAVENOUS
  Filled 2014-12-14: qty 2

## 2014-12-14 MED ORDER — LACTATED RINGERS IV SOLN
INTRAVENOUS | Status: DC | PRN
Start: 2014-12-14 — End: 2014-12-14
  Administered 2014-12-14: 12:00:00 via INTRAVENOUS

## 2014-12-14 MED ORDER — FENTANYL CITRATE (PF) 100 MCG/2ML IJ SOLN
INTRAMUSCULAR | Status: AC
  Filled 2014-12-14: qty 4

## 2014-12-14 MED ORDER — OXYTOCIN 10 UNIT/ML IJ SOLN
INTRAMUSCULAR | Status: AC
  Filled 2014-12-14: qty 4

## 2014-12-14 MED ORDER — DIPHENHYDRAMINE HCL 25 MG PO CAPS
25.0000 mg | ORAL_CAPSULE | ORAL | Status: DC | PRN
  Filled 2014-12-14: qty 1

## 2014-12-14 MED ORDER — DEXTROSE 5 % IV SOLN: 1.0000 ug/kg/h | INTRAVENOUS | Status: DC | PRN

## 2014-12-14 MED ORDER — PHENYLEPHRINE 40 MCG/ML (10ML) SYRINGE FOR IV PUSH (FOR BLOOD PRESSURE SUPPORT)
80.0000 ug | PREFILLED_SYRINGE | INTRAVENOUS | Status: DC | PRN
  Filled 2014-12-14: qty 20

## 2014-12-14 MED ORDER — MISOPROSTOL 25 MCG QUARTER TABLET
25.0000 ug | ORAL_TABLET | ORAL | Status: DC
  Administered 2014-12-14: 25 ug via VAGINAL
  Filled 2014-12-14: qty 0.25

## 2014-12-14 MED ORDER — LIDOCAINE-EPINEPHRINE (PF) 2 %-1:200000 IJ SOLN
INTRAMUSCULAR | Status: DC | PRN
  Administered 2014-12-14 (×3): 5 mL via EPIDURAL

## 2014-12-14 MED ORDER — ONDANSETRON HCL 4 MG/2ML IJ SOLN: 4.0000 mg | Freq: Once | INTRAMUSCULAR | Status: DC | PRN

## 2014-12-14 MED ORDER — FLEET ENEMA 7-19 GM/118ML RE ENEM: 1.0000 | ENEMA | RECTAL | Status: DC | PRN

## 2014-12-14 MED ORDER — SCOPOLAMINE 1 MG/3DAYS TD PT72
1.0000 | MEDICATED_PATCH | Freq: Once | TRANSDERMAL | Status: AC
  Administered 2014-12-14: 1.5 mg via TRANSDERMAL

## 2014-12-14 SURGICAL SUPPLY — 26 items
CLAMP CORD UMBIL (MISCELLANEOUS) IMPLANT
CLOTH BEACON ORANGE TIMEOUT ST (SAFETY) ×2 IMPLANT
DRAPE SHEET LG 3/4 BI-LAMINATE (DRAPES) IMPLANT
DRSG OPSITE POSTOP 4X10 (GAUZE/BANDAGES/DRESSINGS) ×2 IMPLANT
DURAPREP 26ML APPLICATOR (WOUND CARE) ×2 IMPLANT
ELECT REM PT RETURN 9FT ADLT (ELECTROSURGICAL) ×2
ELECTRODE REM PT RTRN 9FT ADLT (ELECTROSURGICAL) ×1 IMPLANT
EXTRACTOR VACUUM M CUP 4 TUBE (SUCTIONS) IMPLANT
GLOVE SURG ORTHO 8.0 STRL STRW (GLOVE) ×2 IMPLANT
GOWN STRL REUS W/TWL LRG LVL3 (GOWN DISPOSABLE) ×4 IMPLANT
KIT ABG SYR 3ML LUER SLIP (SYRINGE) ×2 IMPLANT
NEEDLE HYPO 25X5/8 SAFETYGLIDE (NEEDLE) ×2 IMPLANT
NS IRRIG 1000ML POUR BTL (IV SOLUTION) ×2 IMPLANT
PACK C SECTION WH (CUSTOM PROCEDURE TRAY) ×2 IMPLANT
PAD OB MATERNITY 4.3X12.25 (PERSONAL CARE ITEMS) ×2 IMPLANT
PENCIL SMOKE EVAC W/HOLSTER (ELECTROSURGICAL) ×2 IMPLANT
STRIP CLOSURE SKIN 1/2X4 (GAUZE/BANDAGES/DRESSINGS) ×2 IMPLANT
SUT MNCRL 0 VIOLET CTX 36 (SUTURE) ×3 IMPLANT
SUT MON AB 4-0 PS1 27 (SUTURE) ×2 IMPLANT
SUT MONOCRYL 0 CTX 36 (SUTURE) ×3
SUT PDS AB 1 CT  36 (SUTURE)
SUT PDS AB 1 CT 36 (SUTURE) IMPLANT
SUT VIC AB 1 CTX 36 (SUTURE)
SUT VIC AB 1 CTX36XBRD ANBCTRL (SUTURE) IMPLANT
TOWEL OR 17X24 6PK STRL BLUE (TOWEL DISPOSABLE) ×2 IMPLANT
TRAY FOLEY CATH SILVER 14FR (SET/KITS/TRAYS/PACK) ×2 IMPLANT

## 2014-12-14 NOTE — Progress Notes (Signed)
Dr Rana Snare at bedside, discussing with pt risks and benefits of primary cesarean section. Pt verbalizes and understands, to proceed with cesarean section.

## 2014-12-14 NOTE — Transfer of Care (Signed)
Immediate Anesthesia Transfer of Care Note  Patient: Kaitlyn Ray  Procedure(s) Performed: Procedure(s): CESAREAN SECTION (N/A)  Patient Location: PACU  Anesthesia Type:Epidural  Level of Consciousness: awake, alert , oriented and patient cooperative  Airway & Oxygen Therapy: Patient Spontanous Breathing  Post-op Assessment: Report given to RN and Post -op Vital signs reviewed and stable  Post vital signs: Reviewed and stable  Last Vitals:  Filed Vitals:   12/14/14 1132  BP: 101/74  Pulse: 83  Temp:   Resp: 20    Complications: No apparent anesthesia complications

## 2014-12-14 NOTE — Anesthesia Procedure Notes (Signed)
Epidural Patient location during procedure: OB  Staffing Anesthesiologist: MOSER, CHRISTOPHER Performed by: anesthesiologist   Preanesthetic Checklist Completed: patient identified, surgical consent, pre-op evaluation, timeout performed, IV checked, risks and benefits discussed and monitors and equipment checked  Epidural Patient position: sitting Prep: DuraPrep Patient monitoring: heart rate, cardiac monitor, continuous pulse ox and blood pressure Approach: midline Location: L3-L4 Injection technique: LOR saline  Needle:  Needle type: Tuohy  Needle gauge: 17 G Needle length: 9 cm Needle insertion depth: 5 cm Catheter type: closed end flexible Catheter size: 19 Gauge Catheter at skin depth: 11 cm Test dose: negative and 2% lidocaine with Epi 1:200 K  Assessment Events: blood not aspirated, injection not painful, no injection resistance, negative IV test and no paresthesia  Additional Notes Reason for block:procedure for pain   

## 2014-12-14 NOTE — Op Note (Addendum)
Cesarean Section Procedure Note  Pre-operative Diagnosis: IUP at 40+ weeks, nonreassuring FHR  Post-operative Diagnosis: same  Surgeon: Turner Daniels   Assistants: none  Anesthesia: Epidural  Procedure:  Low Segment Transverse cesarean section  Procedure Details  The patient was seen in the Holding Room. The risks, benefits, complications, treatment options, and expected outcomes were discussed with the patient.  The patient concurred with the proposed plan, giving informed consent.  The site of surgery properly noted/marked.. A Time Out was held and the above information confirmed.  After induction of anesthesia, the patient was draped and prepped in the usual sterile manner. A Pfannenstiel incision was made and carried down through the subcutaneous tissue to the fascia. Fascial incision was made and extended transversely. The fascia was separated from the underlying rectus tissue superiorly and inferiorly. The peritoneum was identified and entered. Peritoneal incision was extended longitudinally. The utero-vesical peritoneal reflection was incised transversely and the bladder flap was bluntly freed from the lower uterine segment. A low transverse uterine incision was made. Delivered from OP vertex presentation was a baby with Apgar scores of 10 at one minute and 10 at five minutes. After the umbilical cord was clamped and cut cord blood was obtained for evaluation. The placenta was removed intact and appeared normal. The uterine outline, tubes and ovaries appeared normal. The uterine incision was closed with running locked sutures of 0 monocryl and imbricated with 0 monocryl. Hemostasis was observed. Lavage was carried out until clear. The peritoneum was then closed with 0 monocryl and rectus muscles plicated in the midline.  After hemostasis was assured, the fascia was then reapproximated with running sutures of 0 Vicryl. Irrigation was applied and after adequate hemostasis was assured, the skin  was reapproximated with subcutaneous sutures using 4-0 monocryl.  Instrument, sponge, and needle counts were correct prior the abdominal closure and at the conclusion of the case. The patient received 2 grams cefotetan preoperatively.  Findings: Viable female  Estimated Blood Loss:  600cc         Specimens: Placenta was sent to labor and delivery         Complications:  None

## 2014-12-14 NOTE — H&P (Signed)
Kaitlyn Ray is a 29 y.o. female presenting for IOL due to postdates.  Admitted earlier for cytotec and only received one due to ctxs too close for repeat.  This am bedside US reconfirmed Vtx presentation.  Preg uncomplicated.  GBS-. History OB History    Gravida Para Term Preterm AB TAB SAB Ectopic Multiple Living   1         0     Past Medical History  Diagnosis Date  . Asthma   . Hx of varicella    Past Surgical History  Procedure Laterality Date  . Breast biopsy    . Breast cyst excision     Family History: family history is not on file. She was adopted. Social History:  reports that she has never smoked. She does not have any smokeless tobacco history on file. She reports that she does not drink alcohol or use illicit drugs.   Prenatal Transfer Tool  Maternal Diabetes: No Genetic Screening: Normal Maternal Ultrasounds/Referrals: Normal Fetal Ultrasounds or other Referrals:  None Maternal Substance Abuse:  No Significant Maternal Medications:  None Significant Maternal Lab Results:  None Other Comments:  None  ROS  Dilation: 2 Effacement (%): 90 Station: -3 Exam by:: dr Rana Snare Blood pressure 137/87, pulse 73, temperature 97.9 F (36.6 C), temperature source Oral, resp. rate 16, height  (1.676 m), weight 171 lb (77.565 kg), last menstrual period 02/23/2014. Exam Physical Exam  Prenatal labs: ABO, Rh: --/--/O POS, O POS (09/27 0135) Antibody: NEG (09/27 0135) Rubella: Immune (02/10 0000) RPR: Nonreactive (02/10 0000)  HBsAg: Negative (02/10 0000)  HIV: Non-reactive (02/10 0000)  GBS: Negative (09/02 0000)   Assessment/Plan: IUP at term for IOL due to postdates.  [place] confirms Vtx.  RN did cx check, not me.  Previously Vtx was very high.  Will augment with pitocin and when Vtx descends will AROM    LOWE,DAVID C 12/14/2014, 8:38 AM

## 2014-12-14 NOTE — Anesthesia Preprocedure Evaluation (Signed)
Anesthesia Evaluation  Patient identified by MRN, date of birth, ID band Patient awake    Reviewed: Allergy & Precautions, NPO status , Patient's Chart, lab work & pertinent test results  History of Anesthesia Complications Negative for: history of anesthetic complications  Airway Mallampati: II  TM Distance: >3 FB Neck ROM: Full    Dental  (+) Teeth Intact   Pulmonary asthma ,  breath sounds clear to auscultation        Cardiovascular negative cardio ROS  Rhythm:Regular     Neuro/Psych negative neurological ROS  negative psych ROS   GI/Hepatic negative GI ROS, Neg liver ROS,   Endo/Other  negative endocrine ROS  Renal/GU negative Renal ROS     Musculoskeletal   Abdominal   Peds  Hematology negative hematology ROS (+)   Anesthesia Other Findings   Reproductive/Obstetrics (+) Pregnancy                             Anesthesia Physical Anesthesia Plan  ASA: II  Anesthesia Plan: Epidural   Post-op Pain Management:    Induction:   Airway Management Planned:   Additional Equipment:   Intra-op Plan:   Post-operative Plan:   Informed Consent: I have reviewed the patients History and Physical, chart, labs and discussed the procedure including the risks, benefits and alternatives for the proposed anesthesia with the patient or authorized representative who has indicated his/her understanding and acceptance.     Plan Discussed with: Anesthesiologist  Anesthesia Plan Comments:         Anesthesia Quick Evaluation  

## 2014-12-14 NOTE — Anesthesia Postprocedure Evaluation (Signed)
  Anesthesia Post-op Note  Patient: Kaitlyn Ray  Procedure(s) Performed: Procedure(s) (LRB): CESAREAN SECTION (N/A)  Patient Location: PACU  Anesthesia Type: Epidural  Level of Consciousness: awake and alert   Airway and Oxygen Therapy: Patient Spontanous Breathing  Post-op Pain: mild  Post-op Assessment: Post-op Vital signs reviewed, Patient's Cardiovascular Status Stable, Respiratory Function Stable, Patent Airway and No signs of Nausea or vomiting  Last Vitals:  Filed Vitals:   12/14/14 1416  BP: 135/71  Pulse: 69  Temp: 37.4 C  Resp: 20    Post-op Vital Signs: stable   Complications: No apparent anesthesia complications

## 2014-12-15 ENCOUNTER — Encounter (HOSPITAL_COMMUNITY): Payer: Self-pay | Admitting: Obstetrics and Gynecology

## 2014-12-15 LAB — BIRTH TISSUE RECOVERY COLLECTION (PLACENTA DONATION)

## 2014-12-15 LAB — CBC
HCT: 26.2 % — ABNORMAL LOW (ref 36.0–46.0)
Hemoglobin: 8.6 g/dL — ABNORMAL LOW (ref 12.0–15.0)
MCH: 24.7 pg — ABNORMAL LOW (ref 26.0–34.0)
MCHC: 32.8 g/dL (ref 30.0–36.0)
MCV: 75.3 fL — AB (ref 78.0–100.0)
PLATELETS: 140 10*3/uL — AB (ref 150–400)
RBC: 3.48 MIL/uL — ABNORMAL LOW (ref 3.87–5.11)
RDW: 15.3 % (ref 11.5–15.5)
WBC: 13.5 10*3/uL — AB (ref 4.0–10.5)

## 2014-12-15 LAB — RPR: RPR Ser Ql: NONREACTIVE

## 2014-12-15 NOTE — Addendum Note (Signed)
Addendum  created 12/15/14 0802 by Janeece Agee, CRNA   Modules edited: Notes Section   Notes Section:  File: [id number]

## 2014-12-15 NOTE — Progress Notes (Signed)
Subjective: Postpartum Day 1: Cesarean Delivery Patient reports tolerating PO.    Objective: Vital signs in last 24 hours: Temp:  [97.9 F (36.6 C)-99.6 F (37.6 C)] 98.4 F (36.9 C) (09/28 0600) Pulse Rate:  [62-154] 65 (09/28 0600) Resp:  [16-25] 18 (09/28 0600) BP: (101-149)/(59-95) 114/59 mmHg (09/28 0600) SpO2:  [97 %-100 %] 97 % (09/27 1730)  Physical Exam:  General: alert and cooperative Lochia: appropriate Uterine Fundus: firm Incision: small drainage noted on r margin of honeycomb dressing DVT Evaluation: No evidence of DVT seen on physical exam. Negative Homan's sign. No cords or calf tenderness. No significant calf/ankle edema.   Recent Labs  12/14/14 0135 12/15/14 0630  HGB 11.0* 8.6*  HCT 32.8* 26.2*    Assessment/Plan: Status post Cesarean section. Doing well postoperatively.  Cbc ordered for  am.  CURTIS,CAROL G 12/15/2014, 8:25 AM

## 2014-12-15 NOTE — Clinical Documentation Improvement (Signed)
OB/GYN  Abnormal Lab/Test Results:  Component      RBC Hemoglobin HCT  Latest Ref Rng      3.87 - 5.11 MIL/uL 12.0 - 15.0 g/dL 16.1 - 09.6 %  0/45/4098     1:35 AM 4.39 11.0 (L) 32.8 (L)  12/15/2014      3.48 (L) 8.6 (L) 26.2 (L)   Possible Clinical Conditions associated with below indicators  Acute blood loss anemia   Other anemia (please specify acuity and type)  Other Condition  Cannot Clinically Determine   Please exercise your independent, professional judgment when responding. A specific answer is not anticipated or expected.  Thank you, Doy Mince, RN [phone] Clinical Documentation Specialist

## 2014-12-15 NOTE — Lactation Note (Signed)
This note was copied from the chart of Kaitlyn Treesa Mccully. Lactation Consultation Note New mom  W/large tubular breast semi flat nipple w/short shaft after stimulation. Encouraged mom to roll nipples in finger tips to evert more. Gave hand pump to evert nipples prior to latching and shells to wear in bra between BF. FOB supportive at bedside. Baby was circumcised today and has been sleepy. Had a few good BF since. Sleepy at this time. Mom has been doing football hold for feedings. Baby was laying on moms chest and fussy. Explained circumcision might had been hurting laying on it. Opened t-shirt for STS, elevated breast w/cloth. Encouraged STS. Hand expression and breast massage demonstrated w/noted colostrum. Baby latched once then went to sleep.  Mom encouraged to feed baby 8-12 times/24 hours and with feeding cues. Mom encouraged to waken baby for feeds. Baby looks jaundice, has had a lot of stools. Educated about newborn behavior, cluster feeding, I&O, supply and demand.Referred to Baby and Me Book in Breastfeeding section Pg. 22-23 for position options and Proper latch demonstration.[facility] brochure given w/resources, support groups and LC services. Patient Name: Kaitlyn Ray [id number] Date: 12/15/2014 Reason for consult: Initial assessment   Maternal Data Has patient been taught Hand Expression?: Yes Does the patient have breastfeeding experience prior to this delivery?: No  Feeding Feeding Type: Breast Fed Length of feed: 0 min  LATCH Score/Interventions Latch: Too sleepy or reluctant, no latch achieved, no sucking elicited. Intervention(s): Skin to skin;Teach feeding cues;Waking techniques Intervention(s): Adjust position;Assist with latch;Breast massage;Breast compression  Audible Swallowing: None Intervention(s): Hand expression Intervention(s): Alternate breast massage;Hand expression  Type of Nipple: Flat Intervention(s): Shells;Hand pump  Comfort (Breast/Nipple): Soft /  non-tender     Hold (Positioning): Assistance needed to correctly position infant at breast and maintain latch. Intervention(s): Breastfeeding basics reviewed;Support Pillows;Position options;Skin to skin  LATCH Score: 4  Lactation Tools Discussed/Used Tools: Pump;Shells Shell Type: Inverted Breast pump type: Manual WIC Program: No Pump Review: Setup, frequency, and cleaning;Milk Storage Initiated by:: Peri Jefferson RN Date initiated:: 12/15/14   Consult Status Consult Status: Follow-up Date: 12/16/14 Follow-up type: In-patient    Charyl Dancer 12/15/2014, 8:50 PM

## 2014-12-15 NOTE — Anesthesia Postprocedure Evaluation (Signed)
  Anesthesia Post-op Note  Patient: Kaitlyn Ray  Procedure(s) Performed: Procedure(s): CESAREAN SECTION (N/A)  Patient Location: Mother/Baby  Anesthesia Type:Epidural  Level of Consciousness: awake, alert  and oriented  Airway and Oxygen Therapy: Patient Spontanous Breathing  Post-op Pain: none  Post-op Assessment: Post-op Vital signs reviewed and Patient's Cardiovascular Status Stable              Post-op Vital Signs: Reviewed and stable  Last Vitals:  Filed Vitals:   12/15/14 0600  BP: 114/59  Pulse: 65  Temp: 36.9 C  Resp: 18    Complications: No apparent anesthesia complications

## 2014-12-16 LAB — CBC
HEMATOCRIT: 25.4 % — AB (ref 36.0–46.0)
HEMOGLOBIN: 8.2 g/dL — AB (ref 12.0–15.0)
MCH: 24.5 pg — ABNORMAL LOW (ref 26.0–34.0)
MCHC: 32.3 g/dL (ref 30.0–36.0)
MCV: 75.8 fL — AB (ref 78.0–100.0)
Platelets: 154 10*3/uL (ref 150–400)
RBC: 3.35 MIL/uL — ABNORMAL LOW (ref 3.87–5.11)
RDW: 15.3 % (ref 11.5–15.5)
WBC: 8.6 10*3/uL (ref 4.0–10.5)

## 2014-12-16 MED ORDER — FERROUS SULFATE 325 (65 FE) MG PO TABS
325.0000 mg | ORAL_TABLET | Freq: Three times a day (TID) | ORAL | Status: DC
  Administered 2014-12-16 – 2014-12-17 (×4): 325 mg via ORAL
  Filled 2014-12-16 (×4): qty 1

## 2014-12-16 NOTE — Lactation Note (Signed)
This note was copied from the chart of Kaitlyn Ray. Lactation Consultation Note  Follow up consultation for 53 hour old [place]. Mom reports that infant is feeding much better today and that her nipples are more everted since using breast shells. He has had 8 feedings and 6 attempt, 3 stools and 1 void in last 24 hours. Weight loss is 6%. Reviewed BF basics, NL NB feeding behavior, nipple care, supply and demand, I/O. Mom reports she has nipple soreness with latch that improves once infant nursing. Comfort gels given with instructions for use for 30 minutes post feed, then replace shells. Mom with many questions about pumping and giving bottles. Enc her to wait 3-4 weeks to introduce bottles and to use wide based nipple. Reviewed pumping. Mom has a Medela DEBP at home for later use. Engorgement prevention reviewed. Reiterated LC Op services and support groups. Enc. Mom to call with questions/concerns.   Patient Name: Kaitlyn Ray [id number] Date: 12/16/2014 Reason for consult: Follow-up assessment   Maternal Data    Feeding Length of feed: 20 min  LATCH Score/Interventions Latch: Grasps breast easily, tongue down, lips flanged, rhythmical sucking.  Audible Swallowing: A few with stimulation Intervention(s): Skin to skin;Hand expression  Type of Nipple: Flat Intervention(s): Shells;Hand pump  Comfort (Breast/Nipple): Soft / non-tender     Hold (Positioning): Assistance needed to correctly position infant at breast and maintain latch. Intervention(s): Breastfeeding basics reviewed  LATCH Score: 7  Lactation Tools Discussed/Used     Consult Status Consult Status: PRN Follow-up type: Call as needed    Ed Blalock 12/16/2014, 5:14 PM

## 2014-12-16 NOTE — Progress Notes (Signed)
Subjective: Postpartum Day 2: Cesarean Delivery Patient reports tolerating PO, + flatus and no problems voiding.    Objective: Vital signs in last 24 hours: Temp:  [98.1 F (36.7 C)-98.6 F (37 C)] 98.1 F (36.7 C) (09/29 0657) Pulse Rate:  [63-68] 68 (09/29 0657) Resp:  [18] 18 (09/29 0657) BP: (112-130)/(74-79) 130/79 mmHg (09/29 0657)  Physical Exam:  General: alert and cooperative Lochia: appropriate Uterine Fundus: firm Incision: healing well, small old drainage noted on honeycomb dressing DVT Evaluation: No evidence of DVT seen on physical exam. Negative Homan's sign. No cords or calf tenderness. No significant calf/ankle edema.   Recent Labs  12/15/14 0630 12/16/14 0540  HGB 8.6* 8.2*  HCT 26.2* 25.4*    Assessment/Plan: Status post Cesarean section. Doing well postoperatively.  [id number].  CURTIS,CAROL G 12/16/2014, 8:38 AM

## 2014-12-17 LAB — COMPREHENSIVE METABOLIC PANEL
ALK PHOS: 124 U/L (ref 38–126)
ALT: 14 U/L (ref 14–54)
AST: 21 U/L (ref 15–41)
Albumin: 3 g/dL — ABNORMAL LOW (ref 3.5–5.0)
Anion gap: 7 (ref 5–15)
CO2: 26 mmol/L (ref 22–32)
CREATININE: 0.57 mg/dL (ref 0.44–1.00)
Calcium: 9.1 mg/dL (ref 8.9–10.3)
Chloride: 104 mmol/L (ref 101–111)
GFR calc Af Amer: >60 mL/min (ref >60)
Glucose, Bld: 104 mg/dL — ABNORMAL HIGH (ref 65–99)
Potassium: 3.5 mmol/L (ref 3.5–5.1)
Sodium: 137 mmol/L (ref 135–145)
TOTAL PROTEIN: 6.1 g/dL — AB (ref 6.5–8.1)
Total Bilirubin: 0.7 mg/dL (ref 0.3–1.2)

## 2014-12-17 LAB — CBC
HEMATOCRIT: 27.8 % — AB (ref 36.0–46.0)
HEMOGLOBIN: 9 g/dL — AB (ref 12.0–15.0)
MCH: 24.5 pg — ABNORMAL LOW (ref 26.0–34.0)
MCHC: 32.4 g/dL (ref 30.0–36.0)
MCV: 75.7 fL — AB (ref 78.0–100.0)
Platelets: 174 10*3/uL (ref 150–400)
RBC: 3.67 MIL/uL — AB (ref 3.87–5.11)
RDW: 15.1 % (ref 11.5–15.5)
WBC: 7.7 10*3/uL (ref 4.0–10.5)

## 2014-12-17 MED ORDER — FERROUS SULFATE 325 (65 FE) MG PO TABS: 325.0000 mg | ORAL_TABLET | Freq: Three times a day (TID) | ORAL | Status: DC

## 2014-12-17 MED ORDER — IBUPROFEN 600 MG PO TABS: 600.0000 mg | ORAL_TABLET | Freq: Four times a day (QID) | ORAL | Status: DC

## 2014-12-17 MED ORDER — OXYCODONE-ACETAMINOPHEN 5-325 MG PO TABS: 1.0000 | ORAL_TABLET | ORAL | Status: DC | PRN

## 2014-12-17 NOTE — Discharge Summary (Signed)
Obstetric Discharge Summary Reason for Admission: induction of labor Prenatal Procedures: ultrasound Intrapartum Procedures: cesarean: low cervical, transverse Postpartum Procedures: none Complications-Operative and Postpartum: none HEMOGLOBIN  Date Value Ref Range Status  12/16/2014 8.2* 12.0 - 15.0 g/dL Final   HCT  Date Value Ref Range Status  12/16/2014 25.4* 36.0 - 46.0 % Final    Physical Exam:  General: alert and cooperative Lochia: appropriate Uterine Fundus: firm Incision: healing well DVT Evaluation: No evidence of DVT seen on physical exam. Negative Homan's sign. No cords or calf tenderness. No significant calf/ankle edema. DTR's 2+  Discharge Diagnoses: Term Pregnancy-delivered  Discharge Information: Date: 12/17/2014 Activity: pelvic rest Diet: routine Medications: PNV, Ibuprofen, Iron and Percocet Condition: stable Instructions: refer to practice specific booklet Discharge to: home   Newborn Data: Live born female  Birth Weight: 7 lb 1.6 oz (3221 g) APGAR: 10, 10  Home with mother.  CURTIS,CAROL G 12/17/2014, 9:18 AM

## 2014-12-17 NOTE — Lactation Note (Signed)
This note was copied from the chart of Kaitlyn Ray. Lactation Consultation Note Having difficulty latching. Baby screaming, cluster feeding, 10% weight loss, low output today. Mom has long tubular breast, hand expression w/good colostrum noted. Fitted w/#16NS taught mom how to position in football using "C" hold firming breast for latching. Baby aggressive and hungry. Hand expression of 3ml colostrum and gave in NS w/curve tip syring. Baby BF kept popping off and on, breast soft and needs firmed for deep latching. Worked w/parents and baby w/deep latching and noted transfer of colostrum.  Mom needed much instruction d/t baby is aggitated. Baby slept soundly after BF.  Patient Name: Kaitlyn Athziri Freundlich [id number] Date: 12/17/2014 Reason for consult: Follow-up assessment;Difficult latch   Maternal Data    Feeding Feeding Type: Breast Milk Length of feed: 20 min  LATCH Score/Interventions Latch: Repeated attempts needed to sustain latch, nipple held in mouth throughout feeding, stimulation needed to elicit sucking reflex. Intervention(s): Skin to skin;Teach feeding cues;Waking techniques Intervention(s): Adjust position;Assist with latch;Breast massage;Breast compression  Audible Swallowing: A few with stimulation Intervention(s): Skin to skin;Hand expression Intervention(s): Alternate breast massage;Hand expression;Skin to skin  Type of Nipple: Everted at rest and after stimulation (wore shells today helped evert some) Intervention(s): Shells;Hand pump;Double electric pump;Reverse pressure  Comfort (Breast/Nipple): Soft / non-tender     Hold (Positioning): Assistance needed to correctly position infant at breast and maintain latch. Intervention(s): Breastfeeding basics reviewed;Support Pillows;Position options;Skin to skin  LATCH Score: 7  Lactation Tools Discussed/Used Tools: Shells;Nipple Dorris Carnes;Pump Nipple shield size: 16 Shell Type: Inverted Breast pump type:  Manual   Consult Status Consult Status: Follow-up Date: 12/17/14 (pm) Follow-up type: In-patient    Charyl Dancer 12/17/2014, 4:56 AM

## 2014-12-17 NOTE — Lactation Note (Addendum)
This note was copied from the chart of Kaitlyn Ray. Lactation Consultation Note  Baby 70 hours old with 9.5% weight loss.  Baby will be staying as baby patient. Mother has been using #16NS on left breast only. Baby cueing and baby seemed hungry and fussy at first at breast.  Did some suck  Mother latched baby on right breast in football hold.  Demonstrated how to achieve and maintain a deep latch. Recommend massaging breast during feeding to keep him active.  Switched to left breast and was able to latch without NS. Noted a few swallows.  Repositioning helped and compression for increased output. Set DEBP to supplement baby.  Recommend she post pump at least 4 times today. Reviewed milk storage and cleaning. Reviewed engorgement care and monitoring voids/stools.  57 Mother having difficulty latching baby and called for help. Mother tearful overwhelmed and worried about weight loss. Pediatrician and RN's  Reassured mother. Had mother prepump and baby latched. Baby comes off and on breast during feeding and needs help w/ depth. Noted posterior lingual frenulum but did not noticed any limited tongue movement. Baby latched and baby breastfed for 30 min. Encouraged parents to rest and limit visitors. Resume pumping after nap.     Patient Name: Kaitlyn Ray ZOXWR'U Date: 12/17/2014 Reason for consult: Infant weight loss   Maternal Data    Feeding Feeding Type: Breast Fed  LATCH Score/Interventions Latch: Grasps breast easily, tongue down, lips flanged, rhythmical sucking.  Audible Swallowing: A few with stimulation Intervention(s): Skin to skin;Hand expression;Alternate breast massage  Type of Nipple: Everted at rest and after stimulation (short shaft) Intervention(s): Shells;Double electric pump  Comfort (Breast/Nipple): Soft / non-tender     Hold (Positioning): Assistance needed to correctly position infant at breast and maintain latch.  LATCH Score:  8  Lactation Tools Discussed/Used Tools: Nipple Dorris Carnes;Shells;Pump Nipple shield size: 16 Breast pump type: Double-Electric Breast Pump Pump Review: Setup, frequency, and cleaning;Milk Storage Initiated by:: Dahlia Byes RN Date initiated:: 12/17/14   Consult Status Consult Status: Follow-up Date: 12/18/14 Follow-up type: In-patient    Dahlia Byes [facility] 12/17/2014, 11:00 AM

## 2014-12-17 NOTE — Progress Notes (Signed)
Subjective: Postpartum Day 3: Cesarean Delivery Patient reports tolerating PO, + flatus and no problems voiding.  Breastfeeding improving. Reports did have HA yesterday, denies HA today, blurred vision or RUQ pain, no problems with BP during pregnancy  Objective: Vital signs in last 24 hours: Temp:  [98 F (36.7 C)-98.2 F (36.8 C)] 98.2 F (36.8 C) (09/30 0555) Pulse Rate:  [62-68] 68 (09/30 0555) Resp:  [18] 18 (09/30 0555) BP: (127-143)/(88-91) 143/91 mmHg (09/30 0555)  Physical Exam:  General: alert and cooperative Lochia: appropriate Uterine Fundus: firm Incision: healing well, honeycomb dressing removed and steri strips applied. No further bleeding noted DVT Evaluation: No evidence of DVT seen on physical exam. Negative Homan's sign. No cords or calf tenderness. No significant calf/ankle edema. DTR's 2+, no clonus   Recent Labs  12/15/14 0630 12/16/14 0540  HGB 8.6* 8.2*  HCT 26.2* 25.4*    Assessment/Plan: Status post Cesarean section. Postoperative course complicated by slight elevation in bp this am  Discharge home with standard precautions and return to clinic in 3 days for bp check. Reviewed signs and symptoms of PIH  recheck bp this am.... 132/80  cbc and cmp ordered.  CURTIS,CAROL G 12/17/2014, 9:11 AM

## 2014-12-18 ENCOUNTER — Ambulatory Visit: Payer: Self-pay

## 2014-12-18 NOTE — Lactation Note (Signed)
This note was copied from the chart of Kaitlyn Ray. Lactation Consultation Note  Patient Name: Kaitlyn Ray ZOXWR'U Date: 12/18/2014 Reason for consult: Follow-up assessment   With this mom of a term infant, now 52 days old. The baby was at 8% weight loss . Mom was having trouble latching the baby, and the baby was causing her severe pain, and he was not able to maintain a latch. Mom decided to pump and bottle feed.  I wrote a pumping plan for mom, advising her to pump every 2-3 hours a day, at least 8 times a day, to breast feed with nipple shield, to help baby maintain latch, . To breast feed only when both she and baby had the time and energy to do so, to pump for 15- 30 minutes, until she stops dripping, followed each time with hand expression. <p, knows to call for questions/concerns to lactation prn.    Maternal Data    Feeding Feeding Type: Formula  LATCH Score/Interventions                      Lactation Tools Discussed/Used     Consult Status Consult Status: Complete Follow-up type: Call as needed    Alfred Levins 12/18/2014, 12:05 PM

## 2016-06-06 ENCOUNTER — Encounter: Payer: Self-pay | Admitting: Neurology

## 2016-06-06 ENCOUNTER — Ambulatory Visit (INDEPENDENT_AMBULATORY_CARE_PROVIDER_SITE_OTHER): Payer: Commercial Managed Care - PPO | Admitting: Neurology

## 2016-06-06 ENCOUNTER — Encounter (INDEPENDENT_AMBULATORY_CARE_PROVIDER_SITE_OTHER): Payer: Self-pay

## 2016-06-06 VITALS — BP 116/77 | HR 77 | Ht 66.0 in | Wt 148.6 lb

## 2016-06-06 DIAGNOSIS — G43009 Migraine without aura, not intractable, without status migrainosus: Secondary | ICD-10-CM | POA: Diagnosis not present

## 2016-06-06 MED ORDER — SUMATRIPTAN SUCCINATE 100 MG PO TABS: 100.0000 mg | ORAL_TABLET | Freq: Once | ORAL | 12 refills | Status: AC | PRN

## 2016-06-06 NOTE — Patient Instructions (Signed)
Remember to drink plenty of fluid, eat healthy meals and do not skip any meals. Try to eat protein with a every meal and eat a healthy snack such as fruit or nuts in between meals. Try to keep a regular sleep-wake schedule and try to exercise daily, particularly in the form of walking, 20-30 minutes a day, if you can.   As far as your medications are concerned, I would like to suggest: Imitrex: Please take one tablet at the onset of your headache. If it does not improve the symptoms please take one additional tablet in 2 hours. Do not take more then 2 tablets in 24hrs. Do not take use more then 2 to 3 times in a week.  I would like to see you back as needed, sooner if we need to. Please call us with any interim questions, concerns, problems, updates or refill requests.   Our phone number is [phone]. We also have an after hours call service for urgent matters and there is a physician on-call for urgent questions. For any emergencies you know to call 911 or go to the nearest emergency room  Sumatriptan tablets What is this medicine? SUMATRIPTAN (soo ma TRIP tan) is used to treat migraines with or without aura. An aura is a strange feeling or visual disturbance that warns you of an attack. It is not used to prevent migraines. This medicine may be used for other purposes; ask your health care provider or pharmacist if you have questions. COMMON BRAND NAME(S): Imitrex, Migraine Pack What should I tell my health care provider before I take this medicine? They need to know if you have any of these conditions: -circulation problems in fingers and toes -diabetes -heart disease -high blood pressure -high cholesterol -history of irregular heartbeat -history of stroke -kidney disease -liver disease -postmenopausal or surgical removal of uterus and ovaries -seizures -smoke tobacco -stomach or intestine problems -an unusual or allergic reaction to sumatriptan, other medicines, foods, dyes, or  preservatives -pregnant or trying to get pregnant -breast-feeding How should I use this medicine? Take this medicine by mouth with a glass of water. Follow the directions on the prescription label. This medicine is taken at the first symptoms of a migraine. It is not for everyday use. If your migraine headache returns after one dose, you can take another dose as directed. You must leave at least 2 hours between doses, and do not take more than 100 mg as a single dose. Do not take more than 200 mg total in any 24 hour period. If there is no improvement at all after the first dose, do not take a second dose without talking to your doctor or health care professional. Do not take your medicine more often than directed. Talk to your pediatrician regarding the use of this medicine in children. Special care may be needed. Overdosage: If you think you have taken too much of this medicine contact a poison control center or emergency room at once. NOTE: This medicine is only for you. Do not share this medicine with others. What if I miss a dose? This does not apply; this medicine is not for regular use. What may interact with this medicine? Do not take this medicine with any of the following medicines: -cocaine -ergot alkaloids like dihydroergotamine, ergonovine, ergotamine, methylergonovine -feverfew -MAOIs like Carbex, Eldepryl, Marplan, Nardil, and Parnate -other medicines for migraine headache like almotriptan, eletriptan, frovatriptan, naratriptan, rizatriptan, zolmitriptan -tryptophan This medicine may also interact with the following medications: -certain medicines for depression,  anxiety, or psychotic disturbances This list may not describe all possible interactions. Give your health care provider a list of all the medicines, herbs, non-prescription drugs, or dietary supplements you use. Also tell them if you smoke, drink alcohol, or use illegal drugs. Some items may interact with your  medicine. What should I watch for while using this medicine? Only take this medicine for a migraine headache. Take it if you get warning symptoms or at the start of a migraine attack. It is not for regular use to prevent migraine attacks. You may get drowsy or dizzy. Do not drive, use machinery, or do anything that needs mental alertness until you know how this medicine affects you. To reduce dizzy or fainting spells, do not sit or stand up quickly, especially if you are an older patient. Alcohol can increase drowsiness, dizziness and flushing. Avoid alcoholic drinks. Smoking cigarettes may increase the risk of heart-related side effects from using this medicine. If you take migraine medicines for 10 or more days a month, your migraines may get worse. Keep a diary of headache days and medicine use. Contact your healthcare professional if your migraine attacks occur more frequently. What side effects may I notice from receiving this medicine? Side effects that you should report to your doctor or health care professional as soon as possible: -allergic reactions like skin rash, itching or hives, swelling of the face, lips, or tongue -bloody or watery diarrhea -hallucination, loss of contact with reality -pain, tingling, numbness in the face, hands, or feet -seizures -signs and symptoms of a blood clot such as breathing problems; changes in vision; chest pain; severe, sudden headache; pain, swelling, warmth in the leg; trouble speaking; sudden numbness or weakness of the face, arm, or leg -signs and symptoms of a dangerous change in heartbeat or heart rhythm like chest pain; dizziness; fast or irregular heartbeat; palpitations, feeling faint or lightheaded; falls; breathing problems -signs and symptoms of a stroke like changes in vision; confusion; trouble speaking or understanding; severe headaches; sudden numbness or weakness of the face, arm, or leg; trouble walking; dizziness; loss of balance or  coordination -stomach pain Side effects that usually do not require medical attention (report to your doctor or health care professional if they continue or are bothersome): -changes in taste -facial flushing -headache -muscle cramps -muscle pain -nausea, vomiting -weak or tired This list may not describe all possible side effects. Call your doctor for medical advice about side effects. You may report side effects to FDA at 1-800-FDA-1088. Where should I keep my medicine? Keep out of the reach of children. Store at room temperature between 2 and 30 degrees C (36 and 86 degrees F). Throw away any unused medicine after the expiration date. NOTE: This sheet is a summary. It may not cover all possible information. If you have questions about this medicine, talk to your doctor, pharmacist, or health care provider.  2018 [employer]/Gold Standard (2015-04-07 12:38:23)
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
## 2016-09-19 ENCOUNTER — Encounter (HOSPITAL_COMMUNITY): Payer: Self-pay | Admitting: *Deleted

## 2016-09-19 ENCOUNTER — Inpatient Hospital Stay (HOSPITAL_COMMUNITY)
Admission: AD | Admit: 2016-09-19 | Discharge: 2016-09-19 | Disposition: A | Discharge status: Home / Self Care | Payer: Commercial Managed Care - PPO | Source: Ambulatory Visit | Attending: Obstetrics and Gynecology | Admitting: Obstetrics and Gynecology

## 2016-09-19 DIAGNOSIS — R109 Unspecified abdominal pain: Secondary | ICD-10-CM | POA: Insufficient documentation

## 2016-09-19 DIAGNOSIS — J45909 Unspecified asthma, uncomplicated: Secondary | ICD-10-CM | POA: Insufficient documentation

## 2016-09-19 DIAGNOSIS — N939 Abnormal uterine and vaginal bleeding, unspecified: Secondary | ICD-10-CM | POA: Diagnosis not present

## 2016-09-19 DIAGNOSIS — Z3202 Encounter for pregnancy test, result negative: Secondary | ICD-10-CM

## 2016-09-19 DIAGNOSIS — R102 Pelvic and perineal pain: Secondary | ICD-10-CM

## 2016-09-19 LAB — URINALYSIS, ROUTINE W REFLEX MICROSCOPIC
BACTERIA UA: NONE SEEN
Bilirubin Urine: NEGATIVE
Glucose, UA: NEGATIVE mg/dL
Ketones, ur: NEGATIVE mg/dL
Leukocytes, UA: NEGATIVE
Nitrite: NEGATIVE
PH: 7 (ref 5.0–8.0)
Protein, ur: NEGATIVE mg/dL
SPECIFIC GRAVITY, URINE: 1.024 (ref 1.005–1.030)

## 2016-09-19 LAB — POCT PREGNANCY, URINE: PREG TEST UR: NEGATIVE

## 2016-09-19 NOTE — MAU Note (Signed)
Pt started bleeding Friday has discomfort in lower abdomen feels like "a hole".  Passed a 'membrane like structure' a few hours ago, brought it with her.  Bleeding is scant.

## 2016-09-19 NOTE — Discharge Instructions (Signed)
Abnormal Uterine Bleeding Abnormal uterine bleeding means bleeding more than usual from your uterus. It can include:  Bleeding between periods.  Bleeding after sex.  Bleeding that is heavier than normal.  Periods that last longer than usual.  Bleeding after you have stopped having your period (menopause).  There are many problems that may cause this. You should see a doctor for any kind of bleeding that is not normal. Treatment depends on the cause of the bleeding. Follow these instructions at home:  Watch your condition for any changes.  Do not use tampons, douche, or have sex, if your doctor tells you not to.  Change your pads often.  Get regular well-woman exams. Make sure they include a pelvic exam and cervical cancer screening.  Keep all follow-up visits as told by your doctor. This is important. Contact a doctor if:  The bleeding lasts more than one week.  You feel dizzy at times.  You feel like you are going to throw up (nauseous).  You throw up. Get help right away if:  You pass out.  You have to change pads every hour.  You have belly (abdominal) pain.  You have a fever.  You get sweaty.  You get weak.  You passing large blood clots from your vagina. Summary  Abnormal uterine bleeding means bleeding more than usual from your uterus.  There are many problems that may cause this. You should see a doctor for any kind of bleeding that is not normal.  Treatment depends on the cause of the bleeding. This information is not intended to replace advice given to you by your health care provider. Make sure you discuss any questions you have with your health care provider. Document Released: 12/31/2008 Document Revised: 02/28/2016 Document Reviewed: 02/28/2016 Elsevier Interactive Patient Education  2017 Elsevier Inc.  Pelvic Pain, Female Pelvic pain is pain in your lower abdomen, below your belly button and between your hips. The pain may start suddenly  (acute), keep coming back (recurring), or last a long time (chronic). Pelvic pain that lasts longer than six months is considered chronic. Pelvic pain may affect your:  Reproductive organs.  Urinary system.  Digestive tract.  Musculoskeletal system.  There are many potential causes of pelvic pain. Sometimes, the pain can be a result of digestive or urinary conditions, strained muscles or ligaments, or even reproductive conditions. Sometimes the cause of pelvic pain is not known. Follow these instructions at home:  Take over-the-counter and prescription medicines only as told by your health care provider.  Rest as told by your health care provider.  Do not have sex it if hurts.  Keep a journal of your pelvic pain. Write down: ? When the pain started. ? Where the pain is located. ? What seems to make the pain better or worse, such as food or your menstrual cycle. ? Any symptoms you have along with the pain.  Keep all follow-up visits as told by your health care provider. This is important. Contact a health care provider if:  Medicine does not help your pain.  Your pain comes back.  You have new symptoms.  You have abnormal vaginal discharge or bleeding, including bleeding after menopause.  You have a fever or chills.  You are constipated.  You have blood in your urine or stool.  You have foul-smelling urine.  You feel weak or lightheaded. Get help right away if:  You have sudden severe pain.  Your pain gets steadily worse.  You have severe pain   along with fever, nausea, vomiting, or excessive sweating.  You lose consciousness. This information is not intended to replace advice given to you by your health care provider. Make sure you discuss any questions you have with your health care provider. Document Released: 01/31/2004 Document Revised: 03/30/2015 Document Reviewed: 12/24/2014 Elsevier Interactive Patient Education  2018 [employer]

## 2016-09-19 NOTE — MAU Provider Note (Signed)
History     CSN: [id number]  Arrival date and time: 09/19/16 1607   None     Chief Complaint  Patient presents with  . Other    passed membrane like structure   HPI Kaitlyn Ray is 31 y.o. G1P1001  Presents with reports of abnormal vaginal bleeding that began 5 days ago.  3 hrs ago she passed tissue, which she brought in with her.  She is having mild cramping that she describes as a "weird feeling like I have a hole in the mid lower abdomen, rating as a 2/10.  She uses Nuvaring for contraception, leaving it in place for 28, removing and inserting one that day to avoid menstrual bleeding.  She has Nuvaring in place now.  She is a patient of Dr. Henderson Cloudomblin.     Past Medical History:  Diagnosis Date  . Asthma   . Hx of varicella     Past Surgical History:  Procedure Laterality Date  . BREAST BIOPSY    . BREAST CYST EXCISION    . CESAREAN SECTION N/A 12/14/2014   Procedure: CESAREAN SECTION;  Surgeon: Candice Campavid Lowe, MD;  Location: [facility];  Service: Obstetrics;  Laterality: N/A;    Family History  Problem Relation Age of Onset  . Adopted: Yes  . Diabetes Father   . Hypertension Father     Social History  Substance Use Topics  . Smoking status: Never Smoker  . Smokeless tobacco: Never Used  . Alcohol use No     Comment: Occasionally    Allergies: No Known Allergies  Prescriptions Prior to Admission  Medication Sig Dispense Refill Last Dose  . albuterol (PROVENTIL HFA;VENTOLIN HFA) 108 (90 BASE) MCG/ACT inhaler Inhale 2 puffs into the lungs every 6 (six) hours as needed for wheezing or shortness of breath.    Taking  . docusate sodium (COLACE) 100 MG capsule Take 100 mg by mouth daily as needed for mild constipation.    Taking  . ibuprofen (ADVIL,MOTRIN) 200 MG tablet Take 800 mg by mouth every 6 (six) hours as needed.   Taking  . SUMAtriptan (IMITREX) 100 MG tablet Take 1 tablet (100 mg total) by mouth once as needed. May repeat in 2 hours if headache persists or recurs.  10 tablet 12     Review of Systems  Constitutional: Negative for fever.  Respiratory: Negative for chest tightness.   Cardiovascular: Negative for chest pain.  Gastrointestinal: Positive for abdominal pain. Negative for nausea and vomiting. Abdominal distention: cramping.  Genitourinary: Positive for pelvic pain and vaginal bleeding. Negative for dysuria and frequency. Hematuria: cramping.  Neurological: Negative for dizziness and light-headedness.   Physical Exam   Blood pressure 125/78, pulse 77, temperature 97.8 F (36.6 C), resp. rate 16, last menstrual period 08/07/2016, unknown if currently breastfeeding.  Physical Exam  Nursing note and vitals reviewed. Constitutional: She is oriented to person, place, and time. She appears well-developed and well-nourished. No distress.  HENT:  Head: Normocephalic.  Neck: Normal range of motion.  Cardiovascular: Normal rate.   Respiratory: Effort normal.  GI: There is no tenderness. There is no rebound and no guarding.  Genitourinary: There is no rash, tenderness or lesion on the right labia. There is no rash, tenderness or lesion on the left labia. Uterus is not enlarged and not tender. Cervix exhibits no motion tenderness, no discharge and no friability. Right adnexum displays no mass, no tenderness and no fullness. Left adnexum displays no mass, no tenderness and no fullness. There  is bleeding (small amount of dark red blood with 2 small clots noted) in the vagina.  Foreign body: Nuvaring in place.  Neurological: She is alert and oriented to person, place, and time.  Skin: Skin is warm and dry.  Psychiatric: She has a normal mood and affect. Her behavior is normal. Thought content normal.    MAU Course  Procedures  MDM MSE Exam Tissue to pathology Consulted with Dr. Vincente Poli-- order given to have tissue sent to pathology.  F/U with Dr. Henderson Cloud. Call for heavy bleeding and pain.  Assessment and Plan  A;  Abnormal uterine bleeding        Cramping      Tissue passed       Negative UPT       Nuvaring contraception  P:  Instructed patient to call to f/u with Dr. Henderson Cloud      Report increase in pain and bleeding      Pelvic rest until bleeding stops      Tissue sent to pathology   Dennison Mascot Perseus Westall 09/19/2016, 4:38 PM
